# Patient Record
Sex: Female | Born: 1955 | Race: Black or African American | Hispanic: No | Smoking: Never smoker
Health system: Southern US, Community
[De-identification: ages and names within clinical notes are randomized; demographics above are authoritative.]

## PROBLEM LIST (undated history)

## (undated) DIAGNOSIS — I10 Essential (primary) hypertension: Secondary | ICD-10-CM

## (undated) HISTORY — PX: ABDOMINAL HYSTERECTOMY: SHX81

---

## 1997-09-26 ENCOUNTER — Encounter: Admission: RE | Admit: 1997-09-26 | Discharge: 1997-09-26 | Payer: Self-pay | Admitting: Obstetrics

## 1997-09-26 ENCOUNTER — Other Ambulatory Visit: Admission: RE | Admit: 1997-09-26 | Discharge: 1997-09-26 | Payer: Self-pay | Admitting: Obstetrics

## 2010-07-30 ENCOUNTER — Emergency Department (INDEPENDENT_AMBULATORY_CARE_PROVIDER_SITE_OTHER): Payer: PRIVATE HEALTH INSURANCE

## 2010-07-30 ENCOUNTER — Emergency Department (HOSPITAL_BASED_OUTPATIENT_CLINIC_OR_DEPARTMENT_OTHER)
Admission: EM | Admit: 2010-07-30 | Discharge: 2010-07-30 | Disposition: A | Discharge status: Home / Self Care | Payer: PRIVATE HEALTH INSURANCE | Attending: Emergency Medicine | Admitting: Emergency Medicine

## 2010-07-30 DIAGNOSIS — M79609 Pain in unspecified limb: Secondary | ICD-10-CM

## 2010-07-30 DIAGNOSIS — K219 Gastro-esophageal reflux disease without esophagitis: Secondary | ICD-10-CM | POA: Insufficient documentation

## 2010-07-30 DIAGNOSIS — M25559 Pain in unspecified hip: Secondary | ICD-10-CM | POA: Insufficient documentation

## 2010-07-30 DIAGNOSIS — I10 Essential (primary) hypertension: Secondary | ICD-10-CM | POA: Insufficient documentation

## 2010-07-30 DIAGNOSIS — N9489 Other specified conditions associated with female genital organs and menstrual cycle: Secondary | ICD-10-CM | POA: Insufficient documentation

## 2010-09-02 ENCOUNTER — Emergency Department (HOSPITAL_BASED_OUTPATIENT_CLINIC_OR_DEPARTMENT_OTHER)
Admission: EM | Admit: 2010-09-02 | Discharge: 2010-09-02 | Disposition: A | Discharge status: Home / Self Care | Payer: PRIVATE HEALTH INSURANCE | Attending: Emergency Medicine | Admitting: Emergency Medicine

## 2010-09-02 DIAGNOSIS — I10 Essential (primary) hypertension: Secondary | ICD-10-CM | POA: Insufficient documentation

## 2010-09-02 DIAGNOSIS — B9689 Other specified bacterial agents as the cause of diseases classified elsewhere: Secondary | ICD-10-CM | POA: Insufficient documentation

## 2010-09-02 DIAGNOSIS — K219 Gastro-esophageal reflux disease without esophagitis: Secondary | ICD-10-CM | POA: Insufficient documentation

## 2010-09-02 DIAGNOSIS — N760 Acute vaginitis: Secondary | ICD-10-CM | POA: Insufficient documentation

## 2010-09-02 DIAGNOSIS — N390 Urinary tract infection, site not specified: Secondary | ICD-10-CM | POA: Insufficient documentation

## 2010-09-02 DIAGNOSIS — A499 Bacterial infection, unspecified: Secondary | ICD-10-CM | POA: Insufficient documentation

## 2010-09-02 LAB — URINE MICROSCOPIC-ADD ON

## 2010-09-02 LAB — URINALYSIS, ROUTINE W REFLEX MICROSCOPIC
Glucose, UA: NEGATIVE mg/dL
Hgb urine dipstick: NEGATIVE
Protein, ur: NEGATIVE mg/dL
Specific Gravity, Urine: 1.014 (ref 1.005–1.030)
pH: 6 (ref 5.0–8.0)

## 2010-09-02 LAB — WET PREP, GENITAL
Trich, Wet Prep: NONE SEEN
Yeast Wet Prep HPF POC: NONE SEEN

## 2010-09-03 LAB — GC/CHLAMYDIA PROBE AMP, GENITAL: GC Probe Amp, Genital: NEGATIVE

## 2010-12-12 ENCOUNTER — Emergency Department (INDEPENDENT_AMBULATORY_CARE_PROVIDER_SITE_OTHER): Payer: No Typology Code available for payment source

## 2010-12-12 ENCOUNTER — Encounter: Payer: Self-pay | Admitting: Emergency Medicine

## 2010-12-12 ENCOUNTER — Emergency Department (HOSPITAL_BASED_OUTPATIENT_CLINIC_OR_DEPARTMENT_OTHER)
Admission: EM | Admit: 2010-12-12 | Discharge: 2010-12-12 | Disposition: A | Discharge status: Home / Self Care | Payer: No Typology Code available for payment source | Attending: Emergency Medicine | Admitting: Emergency Medicine

## 2010-12-12 DIAGNOSIS — S139XXA Sprain of joints and ligaments of unspecified parts of neck, initial encounter: Secondary | ICD-10-CM | POA: Insufficient documentation

## 2010-12-12 DIAGNOSIS — Y92410 Unspecified street and highway as the place of occurrence of the external cause: Secondary | ICD-10-CM | POA: Insufficient documentation

## 2010-12-12 DIAGNOSIS — M5030 Other cervical disc degeneration, unspecified cervical region: Secondary | ICD-10-CM

## 2010-12-12 DIAGNOSIS — Z043 Encounter for examination and observation following other accident: Secondary | ICD-10-CM

## 2010-12-12 DIAGNOSIS — S161XXA Strain of muscle, fascia and tendon at neck level, initial encounter: Secondary | ICD-10-CM

## 2010-12-12 DIAGNOSIS — M542 Cervicalgia: Secondary | ICD-10-CM | POA: Insufficient documentation

## 2010-12-12 HISTORY — DX: Essential (primary) hypertension: I10
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
## 2011-05-11 ENCOUNTER — Encounter: Payer: Self-pay | Admitting: Family Medicine

## 2011-05-11 ENCOUNTER — Ambulatory Visit (INDEPENDENT_AMBULATORY_CARE_PROVIDER_SITE_OTHER): Payer: PRIVATE HEALTH INSURANCE | Admitting: Family Medicine

## 2011-05-11 DIAGNOSIS — M21619 Bunion of unspecified foot: Secondary | ICD-10-CM

## 2011-05-11 DIAGNOSIS — M79671 Pain in right foot: Secondary | ICD-10-CM

## 2011-05-11 DIAGNOSIS — M79609 Pain in unspecified limb: Secondary | ICD-10-CM

## 2011-05-11 NOTE — Patient Instructions (Signed)
You have bunions and hallux valgus. Most people do not need to undergo surgery for this if we can get the pain and irritation controlled. Buy shoes that have a wide toebox. Use a 1st toe spacer (especially at bedtime) between big and 2nd toes Wear bunion pads around the bunion as much as possible so pressure is not as great on this area. Avoid high heels. Over the counter insole with long arch support to put less pressure on 1st metatarsal may be helpful. Tylenol and/or aleve as needed for pain Icing the area 15 minutes at a time 3-4 times a day. Can consider cortisone shot if pain is significant - results are mixed for this area though. Surgery is a consideration if you don't improve with the conservative measures above.

## 2011-05-12 ENCOUNTER — Encounter: Payer: Self-pay | Admitting: Family Medicine

## 2011-05-12 DIAGNOSIS — M21619 Bunion of unspecified foot: Secondary | ICD-10-CM | POA: Insufficient documentation

## 2011-05-12 DIAGNOSIS — M79671 Pain in right foot: Secondary | ICD-10-CM | POA: Insufficient documentation

## 2011-05-12 NOTE — Progress Notes (Signed)
  Subjective:    Patient ID: Jordan Daniel, female    DOB: January 21, 1956, 56 y.o.   MRN: [id number]  PCP: Dr. Lawson Fiscal  HPI 56 yo F here for right foot pain.  Patient reports having had several years of right foot pain primarily over bunion medially. Has not tried any treatments for this though friends have spoken with her about considering surgery. Pain does shoot across top of foot laterally. No numbness or tingling. Worse when wearing heels or tight shoes that compress bunion. Not taking any medications for this.  Past Medical History  Diagnosis Date  . Hypertension     Current Outpatient Prescriptions on File Prior to Visit  Medication Sig Dispense Refill  . olmesartan (BENICAR) 20 MG tablet Take 20 mg by mouth daily.          Past Surgical History  Procedure Date  . Abdominal hysterectomy     No Known Allergies  History   Social History  . Marital Status: Legally Separated    Spouse Name: N/A    Number of Children: N/A  . Years of Education: N/A   Occupational History  . Not on file.   Social History Main Topics  . Smoking status: Never Smoker   . Smokeless tobacco: Not on file  . Alcohol Use: Yes     socially  . Drug Use: No  . Sexually Active: Not on file   Other Topics Concern  . Not on file   Social History Narrative  . No narrative on file    Family History  Problem Relation Age of Onset  . Diabetes Mother   . Hypertension Mother   . Hypertension Father   . Heart attack Neg Hx   . Sudden death Neg Hx   . Hyperlipidemia Daughter   . Diabetes Daughter     BP 120/78  Pulse 88  Temp(Src) 97.9 F (36.6 C) (Oral)  Ht 5\' 9"  (1.753 m)  Wt 220 lb (99.791 kg)  BMI 32.49 kg/m2  Review of Systems See HPI above.    Objective:   Physical Exam Gen: NAD R foot: Moderately sized bunion with hallux valgus.  Mild hallux rigidus.  Minimal deformity on left. Some redness of bunion. TTP on bunion, less TTP across dorsum of 2nd-5th MTs. No plantar  TTP MT heads. FROM ankle, other digits. NVI distally.     Assessment & Plan:  1. Right foot pain - 2/2 bunion, hallux valgus.  Given toe separators and bunion pads to wear regularly.  She's going to use separators on left side as well to help prevent bunion formation.  Advised to wear comfortable shoes with wide toe box.  Injection a consideration but not typically effective.  Icing, consider orthotics.  Consider surgical intervention if still very painful despite conservative measures.

## 2011-05-12 NOTE — Assessment & Plan Note (Signed)
2/2 bunion, hallux valgus.  Given toe separators and bunion pads to wear regularly.  She's going to use separators on left side as well to help prevent bunion formation.  Advised to wear comfortable shoes with wide toe box.  Injection a consideration but not typically effective.  Icing, consider orthotics.  Consider surgical intervention if still very painful despite conservative measures.

## 2014-09-03 ENCOUNTER — Encounter (HOSPITAL_BASED_OUTPATIENT_CLINIC_OR_DEPARTMENT_OTHER): Payer: Self-pay

## 2014-09-03 ENCOUNTER — Emergency Department (HOSPITAL_BASED_OUTPATIENT_CLINIC_OR_DEPARTMENT_OTHER)
Admission: EM | Admit: 2014-09-03 | Discharge: 2014-09-04 | Disposition: A | Discharge status: Home / Self Care | Payer: 59 | Attending: Emergency Medicine | Admitting: Emergency Medicine

## 2014-09-03 DIAGNOSIS — I10 Essential (primary) hypertension: Secondary | ICD-10-CM | POA: Diagnosis not present

## 2014-09-03 DIAGNOSIS — M25511 Pain in right shoulder: Secondary | ICD-10-CM | POA: Diagnosis not present

## 2014-09-03 DIAGNOSIS — I809 Phlebitis and thrombophlebitis of unspecified site: Secondary | ICD-10-CM | POA: Diagnosis not present

## 2014-09-03 DIAGNOSIS — M25561 Pain in right knee: Secondary | ICD-10-CM | POA: Diagnosis present

## 2014-09-03 NOTE — ED Notes (Signed)
Reports right knee pain and shoulder pain. Also sts vein "protruding" on left side.

## 2014-09-04 ENCOUNTER — Ambulatory Visit (HOSPITAL_BASED_OUTPATIENT_CLINIC_OR_DEPARTMENT_OTHER)
Admission: RE | Admit: 2014-09-04 | Discharge: 2014-09-04 | Disposition: A | Discharge status: Home / Self Care | Payer: 59 | Source: Ambulatory Visit | Attending: Emergency Medicine | Admitting: Emergency Medicine

## 2014-09-04 DIAGNOSIS — M79652 Pain in left thigh: Secondary | ICD-10-CM | POA: Diagnosis present

## 2014-09-04 MED ORDER — NAPROXEN SODIUM 220 MG PO TABS: ORAL_TABLET | ORAL | Status: AC

## 2014-09-04 MED ORDER — NAPROXEN 250 MG PO TABS: 500.0000 mg | ORAL_TABLET | Freq: Once | ORAL | Status: DC

## 2014-09-04 NOTE — ED Notes (Signed)
MD at bedside. 

## 2014-09-04 NOTE — ED Provider Notes (Signed)
CSN: [id number]     Arrival date & time 09/03/14  2350 History   First MD Initiated Contact with Patient 09/04/14 0128     Chief Complaint  Patient presents with  . Abnormal Vein on Leg      (Consider location/radiation/quality/duration/timing/severity/associated sxs/prior Treatment) HPI  This is a 59 year old female who has been undergoing injections in her legs for prominent veins. She has noticed a discoloration in her left anterior thigh in a pattern suggesting an involved vein. She has not had any injections in that area. This area is nontender. She is also complaining of a one-month history of pain in her right shoulder and right knee. Pain in the right shoulder is worse with abduction. Pain in the right knee is worse with ambulation or movement. She states the pain is a deep pain in the joint. She rates her pain as moderate to severe. She has been taking Tylenol only without relief. She denies trauma. There is no associated deformity.  Past Medical History  Diagnosis Date  . Hypertension    Past Surgical History  Procedure Laterality Date  . Abdominal hysterectomy     Family History  Problem Relation Age of Onset  . Diabetes Mother   . Hypertension Mother   . Hypertension Father   . Heart attack Neg Hx   . Sudden death Neg Hx   . Hyperlipidemia Daughter   . Diabetes Daughter    History  Substance Use Topics  . Smoking status: Never Smoker   . Smokeless tobacco: Not on file  . Alcohol Use: Yes     Comment: socially   OB History    No data available     Review of Systems  All other systems reviewed and are negative.   Allergies  Review of patient's allergies indicates no known allergies.  Home Medications   Prior to Admission medications   Medication Sig Start Date End Date Taking? Authorizing Provider  olmesartan (BENICAR) 20 MG tablet Take 20 mg by mouth daily.      Historical Provider, MD   BP 157/90 mmHg  Pulse 68  Temp(Src) 98.2 F (36.8 C) (Oral)   Resp 16  Ht 5\' 9"  (1.753 m)  Wt 220 lb (99.791 kg)  BMI 32.47 kg/m2  SpO2 99%   Physical Exam  General: Well-developed, well-nourished female in no acute distress; appearance consistent with age of record HENT: normocephalic; atraumatic Eyes: Normal appearance Neck: supple Heart: regular rate and rhythm Lungs: Normal respiratory effort and excursion Abdomen: soft; nondistended Extremities: No deformity; full range of motion; pulses normal; no tenderness or deformity of right shoulder, pain on abduction; no tenderness or swelling of her right knee, pain on range of motion Neurologic: Awake, alert and oriented; motor function intact in all extremities and symmetric; no facial droop Skin: Warm and dry; hyperpigmented streak left anterior thigh:   Psychiatric: Normal mood and affect    ED Course  Procedures (including critical care time)   MDM  We'll treat patient for arthritis of the right shoulder and knee. Will refer her to sports medicine. The patient's left thigh suggests superficial thrombophlebitis; we will obtain a Doppler ultrasound later today for further evaluation.  Paula LibraJohn Aletha Allebach, MD 09/04/14 [phone]

## 2016-05-11 ENCOUNTER — Ambulatory Visit: Payer: Self-pay | Admitting: Podiatry

## 2016-05-18 ENCOUNTER — Telehealth: Payer: Self-pay | Admitting: *Deleted

## 2016-05-18 ENCOUNTER — Encounter: Payer: Self-pay | Admitting: Podiatry

## 2016-05-18 ENCOUNTER — Ambulatory Visit (INDEPENDENT_AMBULATORY_CARE_PROVIDER_SITE_OTHER): Payer: BLUE CROSS/BLUE SHIELD | Admitting: Podiatry

## 2016-05-18 ENCOUNTER — Ambulatory Visit (HOSPITAL_BASED_OUTPATIENT_CLINIC_OR_DEPARTMENT_OTHER)
Admission: RE | Admit: 2016-05-18 | Discharge: 2016-05-18 | Disposition: A | Discharge status: Home / Self Care | Payer: BLUE CROSS/BLUE SHIELD | Source: Ambulatory Visit | Attending: Podiatry | Admitting: Podiatry

## 2016-05-18 DIAGNOSIS — M79671 Pain in right foot: Secondary | ICD-10-CM | POA: Insufficient documentation

## 2016-05-18 DIAGNOSIS — R609 Edema, unspecified: Secondary | ICD-10-CM

## 2016-05-18 DIAGNOSIS — M19071 Primary osteoarthritis, right ankle and foot: Secondary | ICD-10-CM | POA: Diagnosis not present

## 2016-05-18 DIAGNOSIS — M2011 Hallux valgus (acquired), right foot: Secondary | ICD-10-CM | POA: Insufficient documentation

## 2016-05-18 DIAGNOSIS — M779 Enthesopathy, unspecified: Secondary | ICD-10-CM

## 2016-05-18 DIAGNOSIS — R0989 Other specified symptoms and signs involving the circulatory and respiratory systems: Secondary | ICD-10-CM

## 2016-05-18 NOTE — Patient Instructions (Signed)
Bunionectomy A bunionectomy is a surgical procedure to remove a bunion. A bunion is a visible bump of bone on the inside of your foot where your big toe meets the rest of your foot. A bunion can develop when pressure turns this bone (first metatarsal) toward the other toes. Shoes that are too tight are the most common cause of bunions. Bunions can also be caused by diseases, such as arthritis and polio. You may need a bunionectomy if your bunion is very large and painful or it affects your ability to walk. Tell a health care provider about:  Any allergies you have.  All medicines you are taking, including vitamins, herbs, eye drops, creams, and over-the-counter medicines.  Any problems you or family members have had with anesthetic medicines.  Any blood disorders you have.  Any surgeries you have had.  Any medical conditions you have. What are the risks? Generally, this is a safe procedure. However, problems may occur, including:  Infection.  Pain.  Nerve damage.  Bleeding or blood clots.  Reactions to medicines.  Numbness, stiffness, or arthritis in your toe.  Foot problems that continue even after the procedure. What happens before the procedure?  Ask your health care provider about:  Changing or stopping your regular medicines. This is especially important if you are taking diabetes medicines or blood thinners.  Taking medicines such as aspirin and ibuprofen. These medicines can thin your blood. Do not take these medicines before your procedure if your health care provider instructs you not to.  Do not drink alcohol before the procedure as directed by your health care provider.  Do not use tobacco products, including cigarettes, chewing tobacco, or electronic cigarettes, before the procedure as directed by your health care provider. If you need help quitting, ask your health care provider.  Ask your health care provider what kind of medicine you will be given during  your procedure. A bunionectomy may be done using one of these:  A medicine that numbs the area (local anesthetic).  A medicine that makes you go to sleep (general anesthetic). If you will be given general anesthetic, do not eat or drink anything after midnight on the night before the procedure or as directed by your health care provider. What happens during the procedure?  An IV tube may be inserted into a vein.  You will be given local anesthetic or general anesthetic.  The surgeon will make a cut (incision) over the enlarged area at the first joint of the big toe. The surgeon will remove the bunion.  You may have more than one incision if any of the bones in your big toe need to be moved. A bone itself may need to be cut.  Sometimes the tissues around the big toe may also need to be cut then tightened or loosened to reposition the toe.  Screws or other hardware may be used to keep your foot in thecorrect position.  The incision will be closed with stitches (sutures) and covered with adhesive strips or another type of bandage (dressing). What happens after the procedure?  You may spend some time in a recovery area.  Your blood pressure, heart rate, breathing rate, and blood oxygen level will be monitored often until the medicines you were given have worn off. This information is not intended to replace advice given to you by your health care provider. Make sure you discuss any questions you have with your health care provider. Document Released: 03/26/2005 Document Revised: 09/18/2015 Document Reviewed: 11/28/2013   Elsevier Interactive Patient Education  2017 [employer]  

## 2016-05-18 NOTE — Progress Notes (Signed)
   Subjective:    Patient ID: Jordan Daniel, female    DOB: 01/05/1956, [age] y.o.   MRN: [id number]  HPI  61 year old female presents the office today for concerns of painful right bunion which is been ongoing for several years. She previous he has seen sports medicine and various pads were dispensed which helps some. She saw her primary care physician a steroid injection was performed which did help some but she continued to get pain to the bunion. She has pain with any type of pressure shoe to the area she points the medial aspect of the first metatarsal head on the bunion site where she gets majority of symptoms. She also has right knee pain and swelling and she is concerned this may be coming from her bunion. She has not had any other treatment for her knee.  Review of Systems  All other systems reviewed and are negative.      Objective:   Physical Exam General: AAO x3, NAD  Dermatological: Skin is warm, dry and supple bilateral. Nails x 10 are well manicured; remaining integument appears unremarkable at this time. There are no open sores, no preulcerative lesions, no rash or signs of infection present.  Vascular: Dorsalis Pedis artery and Posterior Tibial artery pedal pulses are 2/4 bilateral with immedate capillary fill time. There is no pain with calf compression, swelling, warmth, erythema.   Neruologic: Grossly intact via light touch bilateral. Vibratory intact via tuning fork bilateral. Protective threshold with Semmes Wienstein monofilament intact to all pedal sites bilateral.   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 There is mild pain with first MPJ range of motion however there is no crepitation. There is no hypermobility present at the first ray. There is tenderness on the medial aspect of the first metatarsal head on the bunion site. There is no other area pinpoint bony tenderness or pain the vibratory sensation. Muscular strength 5/5 in all groups tested  bilateral.  Gait: Unassisted, Nonantalgic.     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  -I discussed both conservative and surgical treatment options the patient. This time she has attempted multiple conservative treatments without any significant improvement and she would've to discuss surgical intervention. I discussed her Austin bunionectomy with screw fixation. I discussed the surgery as wel as the postoperative course. Discussed with her risks of the surgery including possible recurrence, need for further surgery and advanced arthritis in the first MPJ. She'll likely want to surgery but she will think about this. I will order an arterial study ensure adequate circulation her right foot given history of superficial blood clot but she has mildly decreased PT pulse. -Will refer to Dr. Magnus IvanBlackman for the right knee. I do not believe that her knee pain and swelling is from the bunion. Clydie BraunKaren made he ran appointment today for this.  -Follow-up after ultrasound or sooner if needed.

## 2016-05-18 NOTE — Telephone Encounter (Signed)
Yes, both please.

## 2016-05-18 NOTE — Telephone Encounter (Addendum)
-----   Message from Vivi BarrackMatthew R Wagoner, DPM sent at 05/18/2016 10:58 AM EST ----- Can you please order arterial studies for history of blood clot and also mildly decreased pulse. She will likely have bunion surgery and want to ensure adequate circulation. 05[facility]19/2016[facility]

## 2016-05-28 ENCOUNTER — Ambulatory Visit (INDEPENDENT_AMBULATORY_CARE_PROVIDER_SITE_OTHER): Payer: BLUE CROSS/BLUE SHIELD | Admitting: Orthopaedic Surgery

## 2016-05-28 ENCOUNTER — Other Ambulatory Visit (INDEPENDENT_AMBULATORY_CARE_PROVIDER_SITE_OTHER): Payer: Self-pay | Admitting: Orthopaedic Surgery

## 2016-05-28 ENCOUNTER — Ambulatory Visit (HOSPITAL_BASED_OUTPATIENT_CLINIC_OR_DEPARTMENT_OTHER)
Admission: RE | Admit: 2016-05-28 | Discharge: 2016-05-28 | Disposition: A | Discharge status: Home / Self Care | Payer: BLUE CROSS/BLUE SHIELD | Source: Ambulatory Visit | Attending: Orthopaedic Surgery | Admitting: Orthopaedic Surgery

## 2016-05-28 ENCOUNTER — Encounter (INDEPENDENT_AMBULATORY_CARE_PROVIDER_SITE_OTHER): Payer: Self-pay | Admitting: Orthopaedic Surgery

## 2016-05-28 DIAGNOSIS — M1711 Unilateral primary osteoarthritis, right knee: Secondary | ICD-10-CM

## 2016-05-28 DIAGNOSIS — M25561 Pain in right knee: Secondary | ICD-10-CM

## 2016-05-28 DIAGNOSIS — G8929 Other chronic pain: Secondary | ICD-10-CM

## 2016-05-28 MED ORDER — DICLOFENAC SODIUM 75 MG PO TBEC: 75.0000 mg | DELAYED_RELEASE_TABLET | Freq: Two times a day (BID) | ORAL | 2 refills | Status: DC

## 2016-05-28 MED ORDER — DICLOFENAC SODIUM 75 MG PO TBEC: 75.0000 mg | DELAYED_RELEASE_TABLET | Freq: Two times a day (BID) | ORAL | 2 refills | Status: AC

## 2016-05-28 NOTE — Progress Notes (Signed)
    Patient ID: Jordan Daniel, female   DOB: 10/30/1955, 61 y.o.   MRN: [id number]   Chief Complaint  Patient presents with  . Right Knee - Pain    Jordan RanchLuvenia Defrank is a 61 y.o. female.   HPI 61 yo female with 1-2 yr h/o right knee pain that throbs and aches that's worse at night.  Endorses occasional locking but no other mechanical sxs.  Denies injuries, radiation, numbness, tingling.  Has tried advil with no relief.  Allopurinol no relief. Pain is generalized ache throughout knee.    Review of Systems Complete ROS is neg except HPI  Past Medical History:  Diagnosis Date  . Hypertension     Past Surgical History:  Procedure Laterality Date  . ABDOMINAL HYSTERECTOMY      No Known Allergies  Current Outpatient Prescriptions  Medication Sig Dispense Refill  . allopurinol (ZYLOPRIM) 100 MG tablet TK 2 TS PO QD  2  . diclofenac (VOLTAREN) 75 MG EC tablet Take 1 tablet (75 mg total) by mouth 2 (two) times daily. 30 tablet 2  . losartan-hydrochlorothiazide (HYZAAR) 50-12.5 MG tablet TK 1 T PO D  2  . naproxen sodium (ALEVE) 220 MG tablet Take 2 tablets twice daily as needed for joint pain. Best taken with a meal.    . olmesartan (BENICAR) 20 MG tablet Take 20 mg by mouth daily.      . traMADol (ULTRAM) 50 MG tablet TK 1 T PO 3 XD  0   No current facility-administered medications for this visit.      Physical Exam There were no vitals taken for this visit. Physical Exam   The patient is well developed well nourished and well groomed.   Orientation to person place and time is normal   Mood is pleasant.  Ambulatory status is unremarkable         Right Knee examination:  Inspection: No real tenderness   ROM: Is limited by pain with a maximum flexion arc of 120  Stability: Collateral ligaments are stable, the Lachman test and anterior and posterior drawer tests are normal   Motor exam: Grade 5 motor strength in the quadriceps musculature   Skin: Warm dry and  intact over the right leg                       Neuro: normal sensation   Vascular: 2+ DP pulse with normal color and no edema.   Currently the right lower extremity and knee examination revealed no tenderness or swelling, full range of motion without contracture subluxation atrophy or tremor. Normal muscle tone no instability and the neurovascular status of the limb is normal.    Impression and Plan:  IMAGING: I have independently read and interpreted the xrays as follows: Mild to moderate DJD   Diagnosis and treatment:  Right Osteoarthritis of the knee - recommend HEP, diclofenace - patient declined injection today - f/u prn   N. Glee ArvinMichael Xu, MD [facility] [phone] 11:01 AM

## 2016-05-28 NOTE — Addendum Note (Signed)
Addended by: Mayra ReelXU, N MICHAEL on: 05/28/2016 11:25 AM   Modules accepted: Orders

## 2016-06-01 ENCOUNTER — Inpatient Hospital Stay (HOSPITAL_COMMUNITY): Admission: RE | Admit: 2016-06-01 | Payer: BLUE CROSS/BLUE SHIELD | Source: Ambulatory Visit

## 2016-06-09 NOTE — Progress Notes (Signed)
canceled

## 2016-06-11 ENCOUNTER — Encounter (HOSPITAL_COMMUNITY): Payer: BLUE CROSS/BLUE SHIELD

## 2017-10-11 IMAGING — DX DG KNEE 3 VIEWS*R*
4 series · 4 of 4 positions shown · non-contrast
Comparison: None.

CLINICAL DATA: Right knee pain, no known injury, initial encounter

EXAM:
RIGHT KNEE - 3 VIEW

[knee ap]
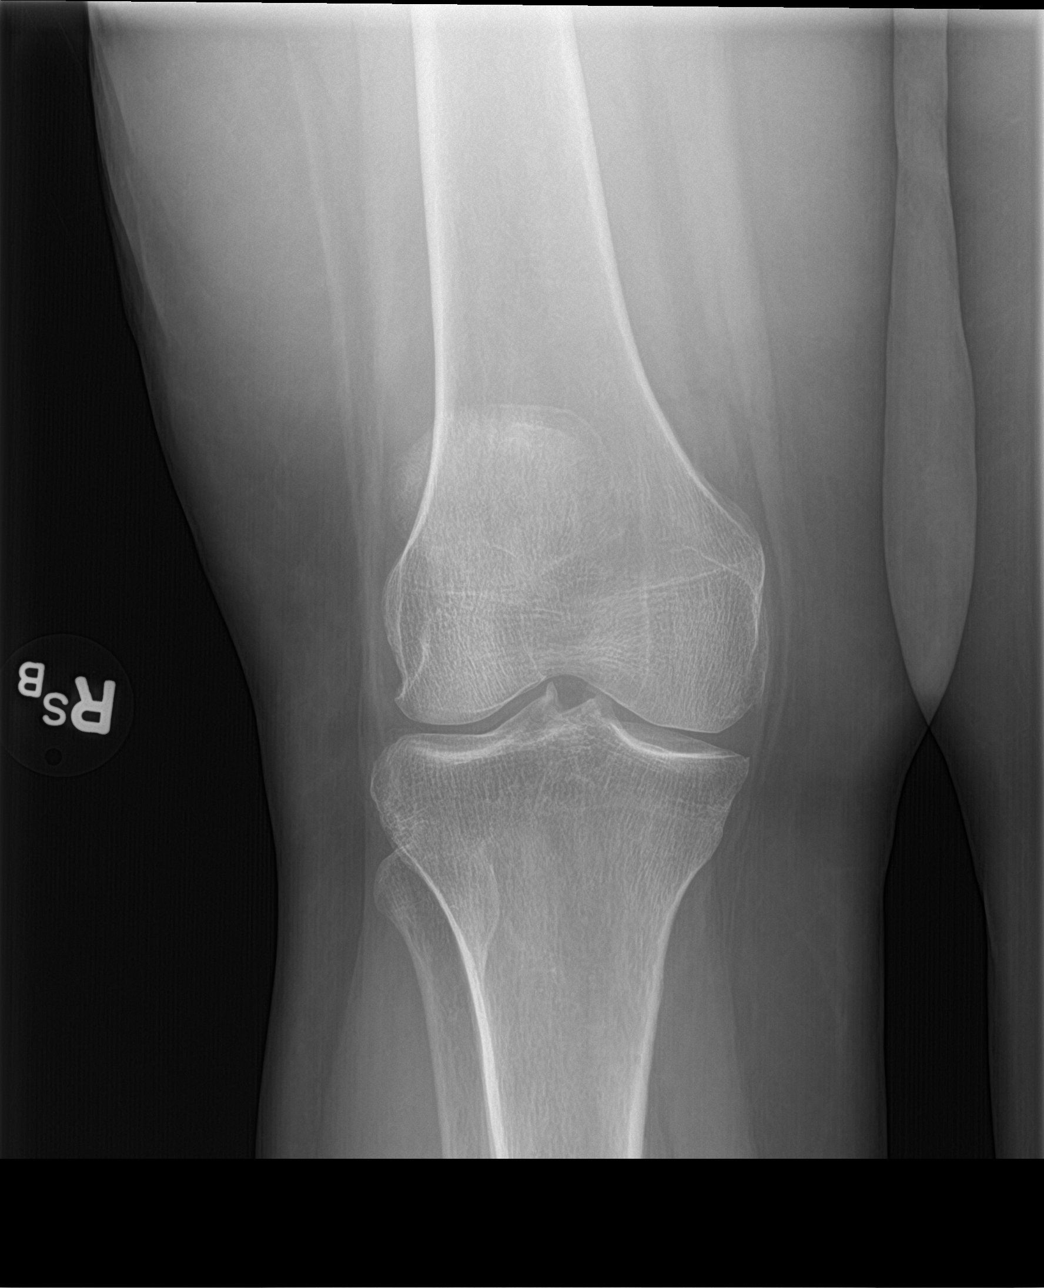

[knee lat]
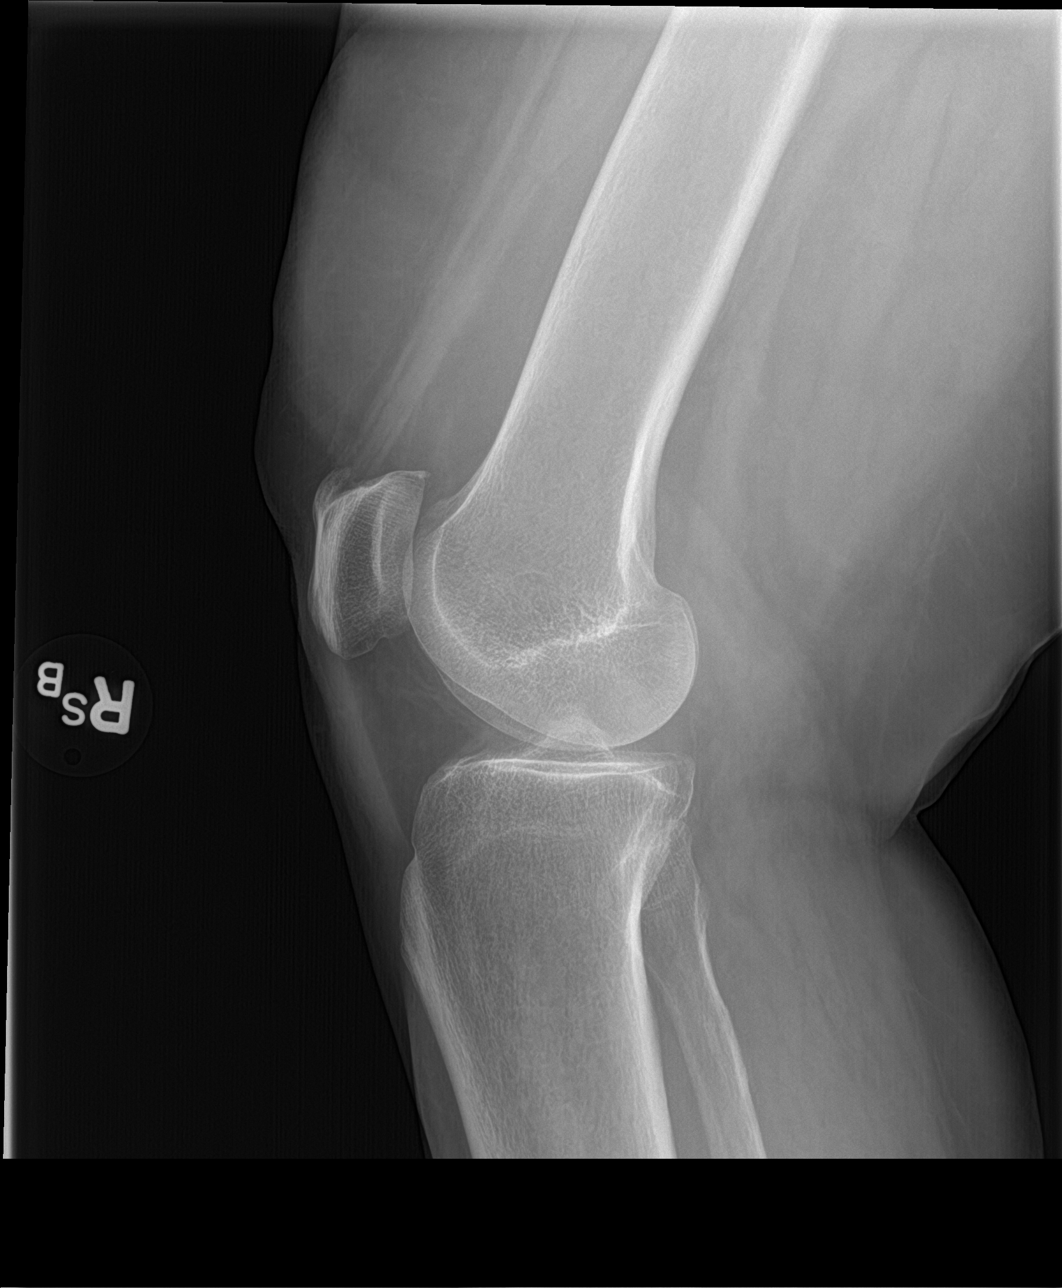

[knee sunrise (1 of 2)]
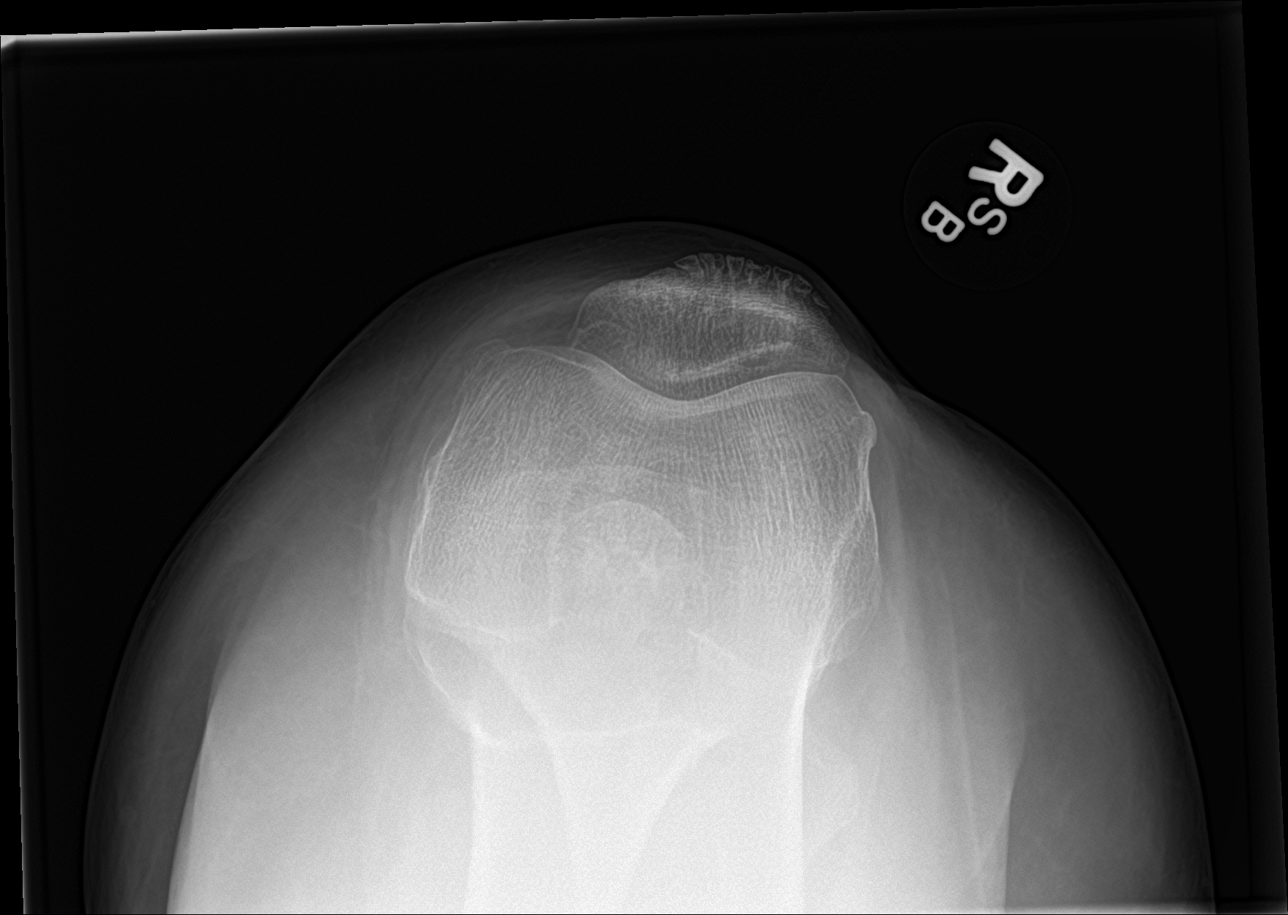

[knee sunrise (2 of 2)]
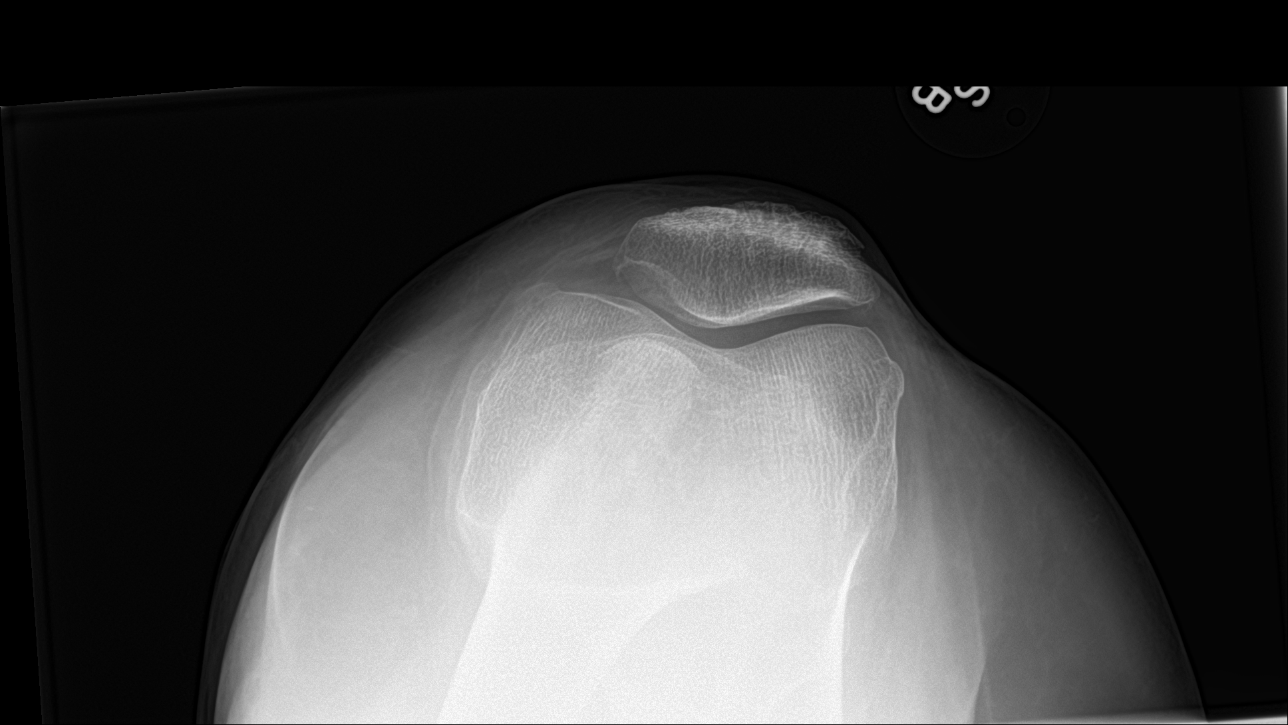

[4 of 4 positions shown; findings below may reference images not displayed]

FINDINGS: No acute fracture or dislocation is noted. No joint effusion is
seen. Mild patellofemoral degenerative changes are noted.
IMPRESSION: Mild degenerative changes without acute abnormality.
